# Patient Record
Sex: Male | Born: 2000 | Race: Black or African American | Hispanic: No | Marital: Single | Smoking: Never smoker
Health system: Southern US, Community
[De-identification: ages and names within clinical notes are randomized; demographics above are authoritative.]

## PROBLEM LIST (undated history)

## (undated) DIAGNOSIS — G4750 Parasomnia, unspecified: Secondary | ICD-10-CM

## (undated) HISTORY — PX: NO PAST SURGERIES: SHX2092

---
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
## 2021-01-05 ENCOUNTER — Encounter (INDEPENDENT_AMBULATORY_CARE_PROVIDER_SITE_OTHER): Payer: Self-pay

## 2021-08-09 ENCOUNTER — Emergency Department (HOSPITAL_COMMUNITY)
Admission: EM | Admit: 2021-08-09 | Discharge: 2021-08-10 | Disposition: A | Discharge status: Home / Self Care | Payer: No Typology Code available for payment source | Attending: Emergency Medicine | Admitting: Emergency Medicine

## 2021-08-09 ENCOUNTER — Encounter (HOSPITAL_COMMUNITY): Payer: Self-pay

## 2021-08-09 ENCOUNTER — Other Ambulatory Visit: Payer: Self-pay

## 2021-08-09 DIAGNOSIS — S0083XA Contusion of other part of head, initial encounter: Secondary | ICD-10-CM | POA: Insufficient documentation

## 2021-08-09 DIAGNOSIS — Y92410 Unspecified street and highway as the place of occurrence of the external cause: Secondary | ICD-10-CM | POA: Diagnosis not present

## 2021-08-09 DIAGNOSIS — S0081XA Abrasion of other part of head, initial encounter: Secondary | ICD-10-CM

## 2021-08-09 DIAGNOSIS — S20212A Contusion of left front wall of thorax, initial encounter: Secondary | ICD-10-CM | POA: Insufficient documentation

## 2021-08-09 DIAGNOSIS — S299XXA Unspecified injury of thorax, initial encounter: Secondary | ICD-10-CM | POA: Diagnosis present

## 2021-08-09 HISTORY — DX: Parasomnia, unspecified: G47.50
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
## 2021-08-10 ENCOUNTER — Emergency Department (HOSPITAL_COMMUNITY): Payer: No Typology Code available for payment source

## 2021-08-10 ENCOUNTER — Encounter (HOSPITAL_COMMUNITY): Payer: Self-pay | Admitting: Emergency Medicine

## 2021-08-10 MED ORDER — NAPROXEN 375 MG PO TABS: ORAL_TABLET | ORAL | 0 refills | Status: AC
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
## 2021-08-10 NOTE — ED Notes (Signed)
Pt states that he is also having central chest pain when he moves or takes a deep breath. He points to his sternal area when describing the pain but notes that it is only intermittent and is position-dependent. NAD during assessment and no obvious injury noted besides a small, superficial abrasion to the center of his forehead. A/O and neurologically intact.

## 2021-08-10 NOTE — ED Notes (Signed)
Unable to obtain signature at time of discharge as there is no signature pad available in hallway. Pt verbalized understanding of discharge instructions and denies questions or concerns. Ambulatory to exit without difficulty.
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
